# Patient Record
Sex: Male | Born: 1980 | Hispanic: Yes | Marital: Married
Health system: Southern US, Community
[De-identification: ages and names within clinical notes are randomized; demographics above are authoritative.]

---

## 2021-02-11 ENCOUNTER — Emergency Department (HOSPITAL_COMMUNITY): Payer: Self-pay

## 2021-02-11 ENCOUNTER — Emergency Department (HOSPITAL_COMMUNITY)
Admission: EM | Admit: 2021-02-11 | Discharge: 2021-02-11 | Disposition: A | Discharge status: Home / Self Care | Payer: Self-pay | Attending: Emergency Medicine | Admitting: Emergency Medicine

## 2021-02-11 ENCOUNTER — Encounter (HOSPITAL_COMMUNITY): Payer: Self-pay | Admitting: Emergency Medicine

## 2021-02-11 DIAGNOSIS — Z23 Encounter for immunization: Secondary | ICD-10-CM | POA: Insufficient documentation

## 2021-02-11 DIAGNOSIS — T1490XA Injury, unspecified, initial encounter: Secondary | ICD-10-CM

## 2021-02-11 DIAGNOSIS — W19XXXA Unspecified fall, initial encounter: Secondary | ICD-10-CM

## 2021-02-11 DIAGNOSIS — Z20822 Contact with and (suspected) exposure to covid-19: Secondary | ICD-10-CM | POA: Insufficient documentation

## 2021-02-11 DIAGNOSIS — W132XXA Fall from, out of or through roof, initial encounter: Secondary | ICD-10-CM | POA: Insufficient documentation

## 2021-02-11 DIAGNOSIS — S0003XA Contusion of scalp, initial encounter: Secondary | ICD-10-CM | POA: Insufficient documentation

## 2021-02-11 DIAGNOSIS — S20212A Contusion of left front wall of thorax, initial encounter: Secondary | ICD-10-CM | POA: Insufficient documentation

## 2021-02-11 DIAGNOSIS — S301XXA Contusion of abdominal wall, initial encounter: Secondary | ICD-10-CM | POA: Insufficient documentation

## 2021-02-11 DIAGNOSIS — S0990XA Unspecified injury of head, initial encounter: Secondary | ICD-10-CM | POA: Insufficient documentation

## 2021-02-11 LAB — COMPREHENSIVE METABOLIC PANEL
ALT: 23 U/L (ref 0–44)
AST: 23 U/L (ref 15–41)
Albumin: 4.8 g/dL (ref 3.5–5.0)
Alkaline Phosphatase: 83 U/L (ref 38–126)
Anion gap: 14 (ref 5–15)
BUN: 21 mg/dL — ABNORMAL HIGH (ref 6–20)
CO2: 20 mmol/L — ABNORMAL LOW (ref 22–32)
Calcium: 10 mg/dL (ref 8.9–10.3)
Chloride: 105 mmol/L (ref 98–111)
Creatinine, Ser: 1.28 mg/dL — ABNORMAL HIGH (ref 0.61–1.24)
GFR, Estimated: >60 mL/min (ref >60)
Glucose, Bld: 113 mg/dL — ABNORMAL HIGH (ref 70–99)
Potassium: 3.7 mmol/L (ref 3.5–5.1)
Sodium: 139 mmol/L (ref 135–145)
Total Bilirubin: 0.8 mg/dL (ref 0.3–1.2)
Total Protein: 7.9 g/dL (ref 6.5–8.1)

## 2021-02-11 LAB — SAMPLE TO BLOOD BANK

## 2021-02-11 LAB — RESP PANEL BY RT-PCR (FLU A&B, COVID) ARPGX2
Influenza A by PCR: NEGATIVE
Influenza B by PCR: NEGATIVE
SARS Coronavirus 2 by RT PCR: NEGATIVE

## 2021-02-11 LAB — CBC
HCT: 47.2 % (ref 39.0–52.0)
Hemoglobin: 15.8 g/dL (ref 13.0–17.0)
MCH: 27 pg (ref 26.0–34.0)
MCHC: 33.5 g/dL (ref 30.0–36.0)
MCV: 80.7 fL (ref 80.0–100.0)
Platelets: 335 10*3/uL (ref 150–400)
RBC: 5.85 MIL/uL — ABNORMAL HIGH (ref 4.22–5.81)
RDW: 12.1 % (ref 11.5–15.5)
WBC: 11.1 10*3/uL — ABNORMAL HIGH (ref 4.0–10.5)
nRBC: 0 % (ref 0.0–0.2)

## 2021-02-11 LAB — I-STAT CHEM 8, ED
BUN: 34 mg/dL — ABNORMAL HIGH (ref 6–20)
Calcium, Ion: 1.15 mmol/L (ref 1.15–1.40)
Chloride: 105 mmol/L (ref 98–111)
Creatinine, Ser: 1.2 mg/dL (ref 0.61–1.24)
Glucose, Bld: 107 mg/dL — ABNORMAL HIGH (ref 70–99)
HCT: 46 % (ref 39.0–52.0)
Hemoglobin: 15.6 g/dL (ref 13.0–17.0)
Potassium: 5.5 mmol/L — ABNORMAL HIGH (ref 3.5–5.1)
Sodium: 141 mmol/L (ref 135–145)
TCO2: 26 mmol/L (ref 22–32)

## 2021-02-11 LAB — PROTIME-INR
INR: 0.9 (ref 0.8–1.2)
Prothrombin Time: 12.2 seconds (ref 11.4–15.2)

## 2021-02-11 LAB — ETHANOL: Alcohol, Ethyl (B): <10 mg/dL (ref <10)

## 2021-02-11 LAB — LACTIC ACID, PLASMA: Lactic Acid, Venous: 2.8 mmol/L (ref 0.5–1.9)

## 2021-02-11 MED ORDER — MORPHINE SULFATE (PF) 4 MG/ML IV SOLN
4.0000 mg | Freq: Once | INTRAVENOUS | Status: AC
  Administered 2021-02-11: 4 mg via INTRAVENOUS
  Filled 2021-02-11: qty 1

## 2021-02-11 MED ORDER — ONDANSETRON HCL 4 MG/2ML IJ SOLN
4.0000 mg | Freq: Once | INTRAMUSCULAR | Status: AC
  Administered 2021-02-11: 4 mg via INTRAVENOUS
  Filled 2021-02-11: qty 2

## 2021-02-11 MED ORDER — HYDROMORPHONE HCL 1 MG/ML IJ SOLN
1.0000 mg | Freq: Once | INTRAMUSCULAR | Status: AC
  Administered 2021-02-11: 1 mg via INTRAVENOUS

## 2021-02-11 MED ORDER — HYDROMORPHONE HCL 1 MG/ML IJ SOLN
INTRAMUSCULAR | Status: AC
  Filled 2021-02-11: qty 1

## 2021-02-11 MED ORDER — TETANUS-DIPHTH-ACELL PERTUSSIS 5-2.5-18.5 LF-MCG/0.5 IM SUSY
0.5000 mL | PREFILLED_SYRINGE | Freq: Once | INTRAMUSCULAR | Status: AC
  Administered 2021-02-11: 0.5 mL via INTRAMUSCULAR

## 2021-02-11 MED ORDER — IOHEXOL 300 MG/ML  SOLN
100.0000 mL | Freq: Once | INTRAMUSCULAR | Status: AC | PRN
  Administered 2021-02-11: 100 mL via INTRAVENOUS

## 2021-02-11 MED ORDER — HYDROCODONE-ACETAMINOPHEN 5-325 MG PO TABS
1.0000 | ORAL_TABLET | Freq: Four times a day (QID) | ORAL | 0 refills | Status: AC | PRN
Start: 2021-02-11 — End: ?

## 2021-02-11 MED ORDER — LACTATED RINGERS IV BOLUS
1000.0000 mL | Freq: Once | INTRAVENOUS | Status: AC
  Administered 2021-02-11: 1000 mL via INTRAVENOUS

## 2021-02-11 NOTE — ED Provider Notes (Signed)
[facility] EMERGENCY DEPARTMENT Provider Note   CSN: [id number] Arrival date & time: 02/11/21  1543     History Chief Complaint  Patient presents with  . Fall    Eric Fuller is a 40 y.o. male here with trauma.  Patient fell off a 10 foot roof and landed on his left side and his head.  Patient is brought in by his friend.  He is unable to answer any questions.  He has no known medical problems.  Patient was noted to have bruising on the left side of his chest.  The history is provided by the patient and a friend.       History reviewed. No pertinent past medical history.  There are no problems to display for this patient.   History reviewed. No pertinent surgical history.     No family history on file.     Home Medications Prior to Admission medications   Not on File    Allergies    Patient has no known allergies.  Review of Systems   Review of Systems  Unable to perform ROS: Acuity of condition  Musculoskeletal:       L chest wall pain   All other systems reviewed and are negative.   Physical Exam Updated Vital Signs BP 114/77   Pulse 81   Temp (!) 97.3 F (36.3 C) (Oral)   Resp (!) 26   Ht 5\' 6"  (1.676 m)   Wt 70.3 kg   SpO2 100%   BMI 25.02 kg/m   Physical Exam Vitals and nursing note reviewed.  Constitutional:      Comments: Uncomfortable grimacing in pain but unable to answer any questions  HENT:     Head:     Comments: No obvious scalp hematoma    Mouth/Throat:     Mouth: Mucous membranes are dry.  Eyes:     Extraocular Movements: Extraocular movements intact.     Pupils: Pupils are equal, round, and reactive to light.  Neck:     Comments: C-collar in place Cardiovascular:     Rate and Rhythm: Normal rate and regular rhythm.     Pulses: Normal pulses.     Heart sounds: Normal heart sounds.  Pulmonary:     Comments: Tachypneic, bruising on the left chest wall.  No obvious crepitus. Abdominal:     Comments:  Bruising on the left upper abdomen  Musculoskeletal:     Comments: There is some tenderness along the left scapula and some bruising in the left scapular and left lower rib area.  No obvious step-off or midline deformity  Skin:    General: Skin is warm.     Capillary Refill: Capillary refill takes less than 2 seconds.  Neurological:     Comments: Unable to answer questions per patient is moving all extremities  Psychiatric:     Comments: Unable      ED Results / Procedures / Treatments   Labs (all labs ordered are listed, but only abnormal results are displayed) Labs Reviewed  COMPREHENSIVE METABOLIC PANEL - Abnormal; Notable for the following components:      Result Value   CO2 20 (*)    Glucose, Bld 113 (*)    BUN 21 (*)    Creatinine, Ser 1.28 (*)    All other components within normal limits  CBC - Abnormal; Notable for the following components:   WBC 11.1 (*)    RBC 5.85 (*)    All other components  within normal limits  LACTIC ACID, PLASMA - Abnormal; Notable for the following components:   Lactic Acid, Venous 2.8 (*)    All other components within normal limits  I-STAT CHEM 8, ED - Abnormal; Notable for the following components:   Potassium 5.5 (*)    BUN 34 (*)    Glucose, Bld 107 (*)    All other components within normal limits  RESP PANEL BY RT-PCR (FLU A&B, COVID) ARPGX2  ETHANOL  PROTIME-INR  URINALYSIS, ROUTINE W REFLEX MICROSCOPIC  SAMPLE TO BLOOD BANK    EKG None  Radiology CT HEAD WO CONTRAST  Result Date: 02/11/2021 CLINICAL DATA:  Status post fall. EXAM: CT HEAD WITHOUT CONTRAST CT CERVICAL SPINE WITHOUT CONTRAST TECHNIQUE: Multidetector CT imaging of the head and cervical spine was performed following the standard protocol without intravenous contrast. Multiplanar CT image reconstructions of the cervical spine were also generated. COMPARISON:  None. FINDINGS: CT HEAD FINDINGS Brain: No evidence of acute infarction, hemorrhage, hydrocephalus,  extra-axial collection or mass lesion/mass effect. Vascular: No hyperdense vessel or unexpected calcification. Skull: Normal. Negative for fracture or focal lesion. Sinuses/Orbits: No acute finding. Other: None CT CERVICAL SPINE FINDINGS Alignment: Normal. Skull base and vertebrae: No acute fracture. No primary bone lesion or focal pathologic process. Soft tissues and spinal canal: No prevertebral fluid or swelling. No visible canal hematoma. Disc levels:  Normal Upper chest: Negative. Other: None IMPRESSION: 1. No acute intracranial abnormalities. 2. No evidence for cervical spine fracture. Electronically Signed   By: Signa Kell M.D.   On: 02/11/2021 17:04   CT CHEST W CONTRAST  Result Date: 02/11/2021 CLINICAL DATA:  Larey Seat, back pain EXAM: CT CHEST, ABDOMEN, AND PELVIS WITH CONTRAST TECHNIQUE: Multidetector CT imaging of the chest, abdomen and pelvis was performed following the standard protocol during bolus administration of intravenous contrast. CONTRAST:  OMNIPAQUE IOHEXOL 300 MG/ML  SOLN COMPARISON:  02/11/2021 FINDINGS: CT CHEST FINDINGS Cardiovascular: The heart and great vessels are unremarkable without pericardial effusion. No evidence of vascular injury. Mediastinum/Nodes: No enlarged mediastinal, hilar, or axillary lymph nodes. Thyroid gland, trachea, and esophagus demonstrate no significant findings. Lungs/Pleura: No acute airspace disease, effusion, or pneumothorax. Central airways are patent. Musculoskeletal: No acute or destructive bony lesions. Reconstructed images demonstrate no additional findings. CT ABDOMEN PELVIS FINDINGS Hepatobiliary: No hepatic injury or perihepatic hematoma. Gallbladder is unremarkable Pancreas: Unremarkable. No pancreatic ductal dilatation or surrounding inflammatory changes. Spleen: No splenic injury or perisplenic hematoma. Adrenals/Urinary Tract: No adrenal hemorrhage or renal injury identified. Bladder is unremarkable. Incidental punctate less than 2 mm  nonobstructing right renal calculus. Stomach/Bowel: No bowel obstruction or ileus. No bowel wall thickening or inflammatory change. Vascular/Lymphatic: No evidence of vascular injury. No significant atherosclerosis. Anatomic variant with separate origins of the common hepatic artery and celiac axis from the aorta. No pathologic adenopathy. Reproductive: Prostate is unremarkable. Other: No free fluid or free gas.  No abdominal wall hernia. Musculoskeletal: No acute or destructive bony lesions. Reconstructed images demonstrate no additional findings. IMPRESSION: 1. No acute intrathoracic, intra-abdominal, or intrapelvic trauma. 2. Punctate less than 2 mm nonobstructing right renal calculus. Electronically Signed   By: Sharlet Salina M.D.   On: 02/11/2021 17:07   CT CERVICAL SPINE WO CONTRAST  Result Date: 02/11/2021 CLINICAL DATA:  Status post fall. EXAM: CT HEAD WITHOUT CONTRAST CT CERVICAL SPINE WITHOUT CONTRAST TECHNIQUE: Multidetector CT imaging of the head and cervical spine was performed following the standard protocol without intravenous contrast. Multiplanar CT image reconstructions of the cervical spine  were also generated. COMPARISON:  None. FINDINGS: CT HEAD FINDINGS Brain: No evidence of acute infarction, hemorrhage, hydrocephalus, extra-axial collection or mass lesion/mass effect. Vascular: No hyperdense vessel or unexpected calcification. Skull: Normal. Negative for fracture or focal lesion. Sinuses/Orbits: No acute finding. Other: None CT CERVICAL SPINE FINDINGS Alignment: Normal. Skull base and vertebrae: No acute fracture. No primary bone lesion or focal pathologic process. Soft tissues and spinal canal: No prevertebral fluid or swelling. No visible canal hematoma. Disc levels:  Normal Upper chest: Negative. Other: None IMPRESSION: 1. No acute intracranial abnormalities. 2. No evidence for cervical spine fracture. Electronically Signed   By: Signa Kell M.D.   On: 02/11/2021 17:04   CT  ABDOMEN PELVIS W CONTRAST  Result Date: 02/11/2021 CLINICAL DATA:  Larey Seat, back pain EXAM: CT CHEST, ABDOMEN, AND PELVIS WITH CONTRAST TECHNIQUE: Multidetector CT imaging of the chest, abdomen and pelvis was performed following the standard protocol during bolus administration of intravenous contrast. CONTRAST:  OMNIPAQUE IOHEXOL 300 MG/ML  SOLN COMPARISON:  02/11/2021 FINDINGS: CT CHEST FINDINGS Cardiovascular: The heart and great vessels are unremarkable without pericardial effusion. No evidence of vascular injury. Mediastinum/Nodes: No enlarged mediastinal, hilar, or axillary lymph nodes. Thyroid gland, trachea, and esophagus demonstrate no significant findings. Lungs/Pleura: No acute airspace disease, effusion, or pneumothorax. Central airways are patent. Musculoskeletal: No acute or destructive bony lesions. Reconstructed images demonstrate no additional findings. CT ABDOMEN PELVIS FINDINGS Hepatobiliary: No hepatic injury or perihepatic hematoma. Gallbladder is unremarkable Pancreas: Unremarkable. No pancreatic ductal dilatation or surrounding inflammatory changes. Spleen: No splenic injury or perisplenic hematoma. Adrenals/Urinary Tract: No adrenal hemorrhage or renal injury identified. Bladder is unremarkable. Incidental punctate less than 2 mm nonobstructing right renal calculus. Stomach/Bowel: No bowel obstruction or ileus. No bowel wall thickening or inflammatory change. Vascular/Lymphatic: No evidence of vascular injury. No significant atherosclerosis. Anatomic variant with separate origins of the common hepatic artery and celiac axis from the aorta. No pathologic adenopathy. Reproductive: Prostate is unremarkable. Other: No free fluid or free gas.  No abdominal wall hernia. Musculoskeletal: No acute or destructive bony lesions. Reconstructed images demonstrate no additional findings. IMPRESSION: 1. No acute intrathoracic, intra-abdominal, or intrapelvic trauma. 2. Punctate less than 2 mm  nonobstructing right renal calculus. Electronically Signed   By: Sharlet Salina M.D.   On: 02/11/2021 17:07   DG Pelvis Portable  Result Date: 02/11/2021 CLINICAL DATA:  Larey Seat 10 feet from roof EXAM: PORTABLE PELVIS 1-2 VIEWS COMPARISON:  None. FINDINGS: Single frontal view of the pelvis demonstrates no acute displaced fractures. Joint spaces are well preserved. Sacroiliac joints are normal. The soft tissues are unremarkable. IMPRESSION: 1. No acute displaced fracture. Electronically Signed   By: Sharlet Salina M.D.   On: 02/11/2021 16:17   CT T-SPINE NO CHARGE  Result Date: 02/11/2021 CLINICAL DATA:  Level 2 fall.  Back pain EXAM: CT THORACIC SPINE WITHOUT CONTRAST TECHNIQUE: Multidetector CT images of the thoracic were obtained using the standard protocol without intravenous contrast. Imaging data reconstructed from CT chest. COMPARISON:  None. FINDINGS: Alignment: Normal Vertebrae: Negative for fracture.  No focal bone lesion. Paraspinal and other soft tissues: Negative Disc levels: Normal disc spaces. No significant degenerative change. No spinal stenosis. IMPRESSION: Negative Electronically Signed   By: Marlan Palau M.D.   On: 02/11/2021 16:58   CT L-SPINE NO CHARGE  Result Date: 02/11/2021 CLINICAL DATA:  Fall.  Back pain. EXAM: CT LUMBAR SPINE WITHOUT CONTRAST TECHNIQUE: Multidetector CT imaging of the lumbar spine was performed without intravenous contrast administration.  Multiplanar CT image reconstructions were also generated. Imaging dated reconstructed from CT abdomen pelvis. COMPARISON:  None. FINDINGS: Segmentation: Normal Alignment: Normal Vertebrae: Negative for fracture or bone lesion. Paraspinal and other soft tissues: Negative Disc levels: Disc spaces maintained. No spinal stenosis or neural impingement. IMPRESSION: Negative Electronically Signed   By: Marlan Palauharles  Clark M.D.   On: 02/11/2021 16:56   DG Chest Port 1 View  Result Date: 02/11/2021 CLINICAL DATA:  Larey SeatFell 10 feet from roof  EXAM: PORTABLE CHEST 1 VIEW COMPARISON:  None. FINDINGS: Single frontal view of the chest demonstrates an unremarkable cardiac silhouette. No airspace disease, effusion, or pneumothorax. No acute displaced fracture. IMPRESSION: 1. No acute intrathoracic process. Electronically Signed   By: Sharlet SalinaMichael  Brown M.D.   On: 02/11/2021 16:16    Procedures Procedures   Medications Ordered in ED Medications  HYDROmorphone (DILAUDID) 1 MG/ML injection (has no administration in time range)  lactated ringers bolus 1,000 mL (has no administration in time range)  HYDROmorphone (DILAUDID) injection 1 mg (1 mg Intravenous Given 02/11/21 1600)  Tdap (BOOSTRIX) injection 0.5 mL (0.5 mLs Intramuscular Given 02/11/21 1610)  iohexol (OMNIPAQUE) 300 MG/ML solution 100 mL (100 mLs Intravenous Contrast Given 02/11/21 1627)  morphine 4 MG/ML injection 4 mg (4 mg Intravenous Given 02/11/21 1732)  ondansetron (ZOFRAN) injection 4 mg (4 mg Intravenous Given 02/11/21 1732)    ED Course  I have reviewed the triage vital signs and the nursing notes.  Pertinent labs & imaging results that were available during my care of the patient were reviewed by me and considered in my medical decision making (see chart for details).    MDM Rules/Calculators/A&P                         Diona BrownerHector Hinchcliff is a 40 y.o. male here presenting after he fell off a 10 foot roof.  Patient has bruising of the left chest and left scapula.  Patient is also unable to answer any questions and he is clearly in pain. Will get trauma scans and trauma x-rays   6:17 PM Labs showed lactate 2.8. CT showed no fractures or pneumothorax or hemothorax. Pain controlled. Will dc home leg pain medicine.  Likely muscle strain  Final Clinical Impression(s) / ED Diagnoses Final diagnoses:  Fall    Rx / DC Orders ED Discharge Orders    None       Charlynne PanderYao, Kesia Dalto Hsienta, MD 02/11/21 1820

## 2021-02-11 NOTE — ED Notes (Signed)
To CT

## 2021-02-11 NOTE — ED Triage Notes (Signed)
Patient brought in by friend after falling 10 feet from a roof and landing on his left side and head. Patient tachypneic, appears alert but is not answering questions. Friend states the patient does not have any known medical problems or allergies.

## 2021-02-11 NOTE — Discharge Instructions (Addendum)
Take motrin for pain   Take vicodin for severe pain   Use incentive spirometer every 3-4 hours during the day to help expand your lungs  See your doctor   Return to ER if you have worse chest pain, trouble breathing, fever, cough

## 2021-02-11 NOTE — Progress Notes (Signed)
Orthopedic Tech Progress Note Patient Details:  Eric Fuller 12/20/1873 [id number] LEVEL 2 TRAUMA Patient ID: Eric Fuller, male   DOB: 12/20/1873, 39 y.o.   MRN: [id number]   [name] 02/11/2021, 4:07 PM

## 2021-09-09 IMAGING — CT CT T SPINE W/O CM
3 of 4 series · 11 of 33 positions shown, 13 images · non-contrast
Comparison: None.

CLINICAL DATA: Level 2 fall.  Back pain

EXAM:
CT THORACIC SPINE WITHOUT CONTRAST
TECHNIQUE: Multidetector CT images of the thoracic were obtained using the
standard protocol without intravenous contrast. Imaging data
reconstructed from CT chest.

[Series 3: tspine st · axial · 0.29mm/px · z∈[+695,+917]mm · 3 of 167 slices shown, 4 images]
[im 28/167  soft-tissue]
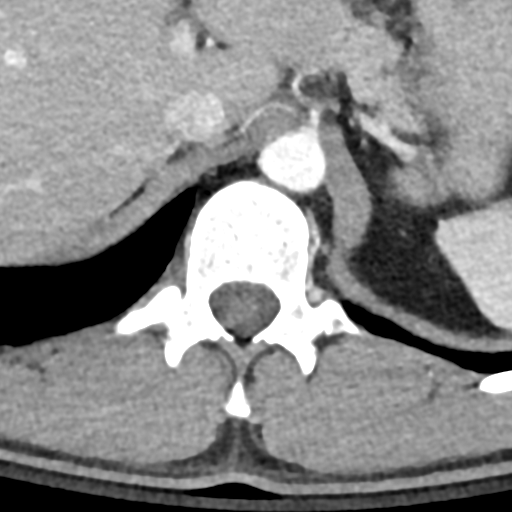
[im 28/167  bone]
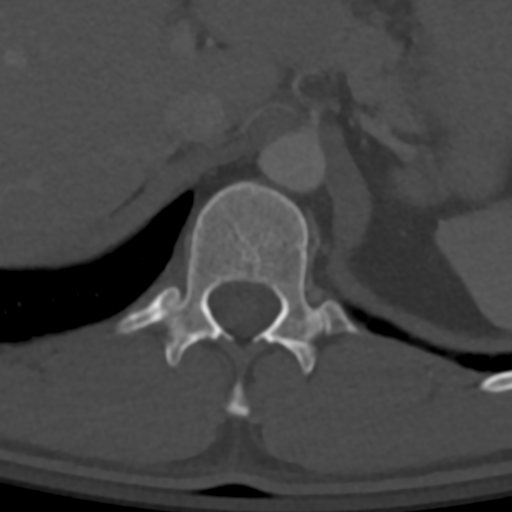
[im 84/167  bone]
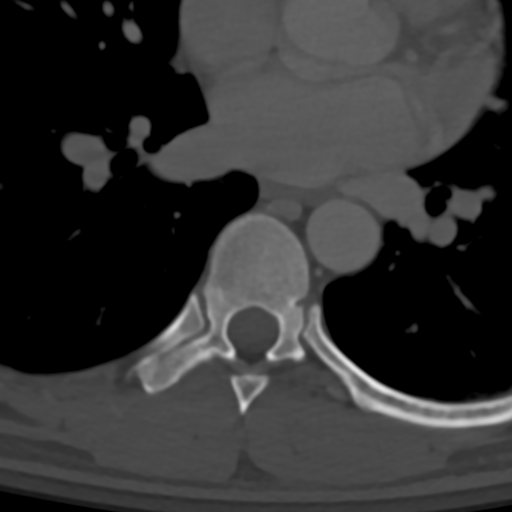
[im 139/167  bone]
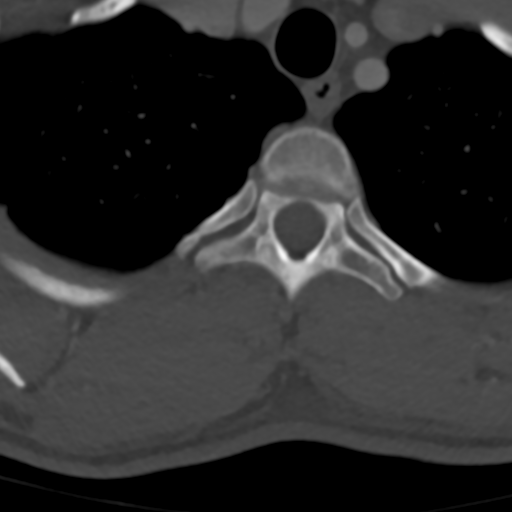

[Series 5: tspine bone cor · coronal · 0.37mm/px · 3 of 77 slices shown]
[im 16/77  bone]
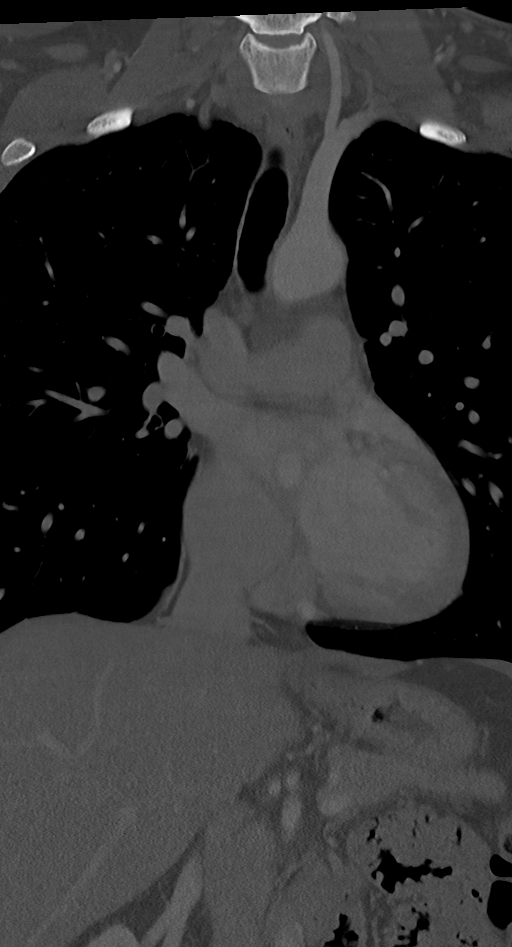
[im 31/77  bone]
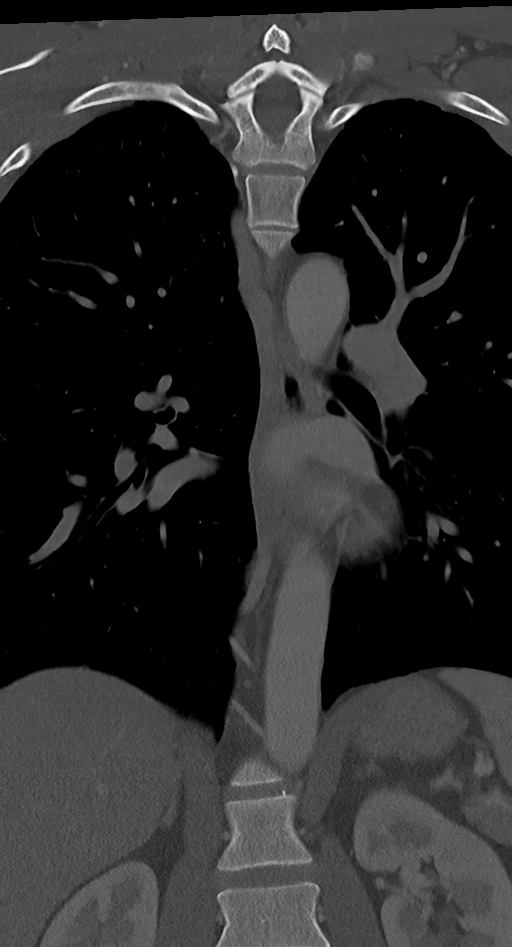
[im 46/77  bone]
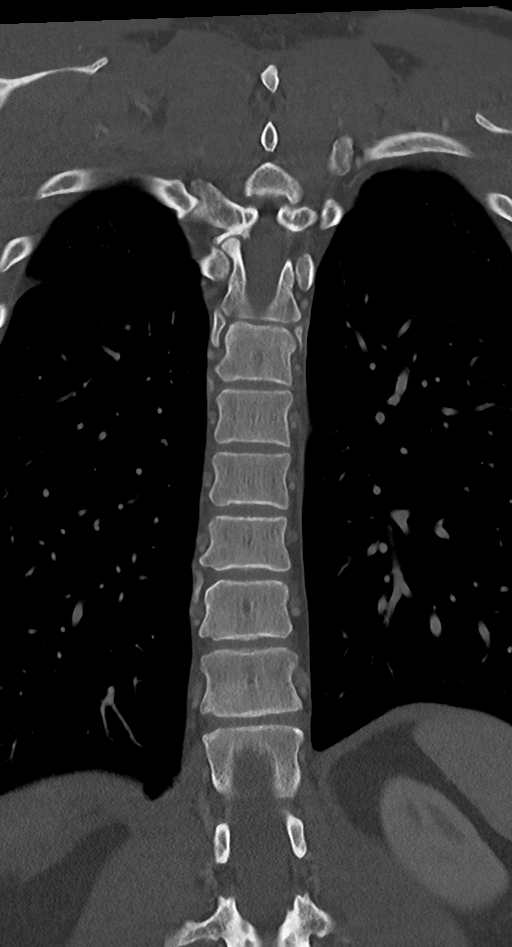

[Series 6: tspine bone sag · sagittal · 0.34mm/px · 5 of 58 slices shown, 6 images]
[im 20/58  bone]
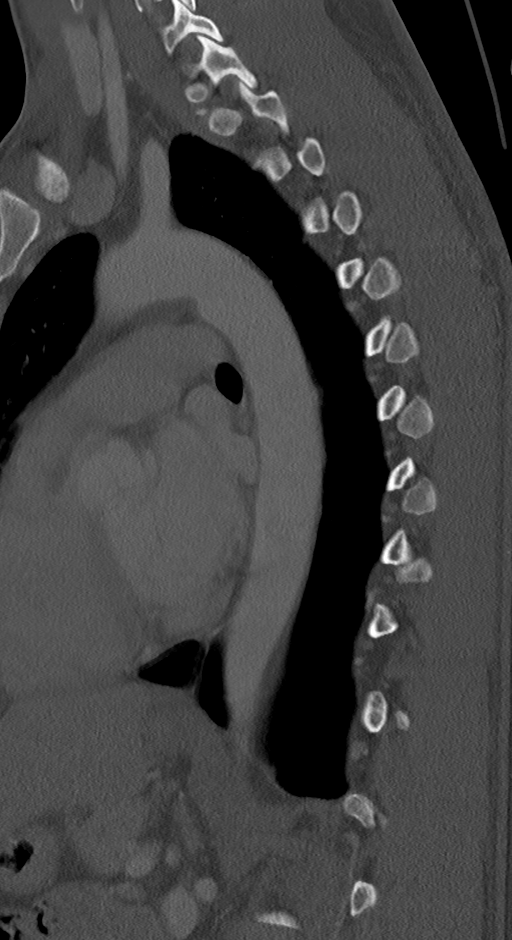
[im 24/58  bone]
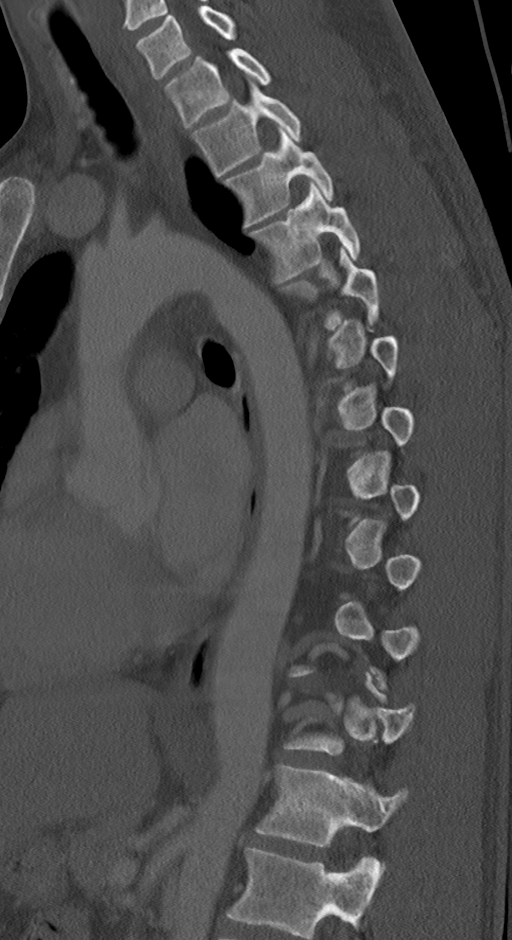
[im 29/58  soft-tissue]
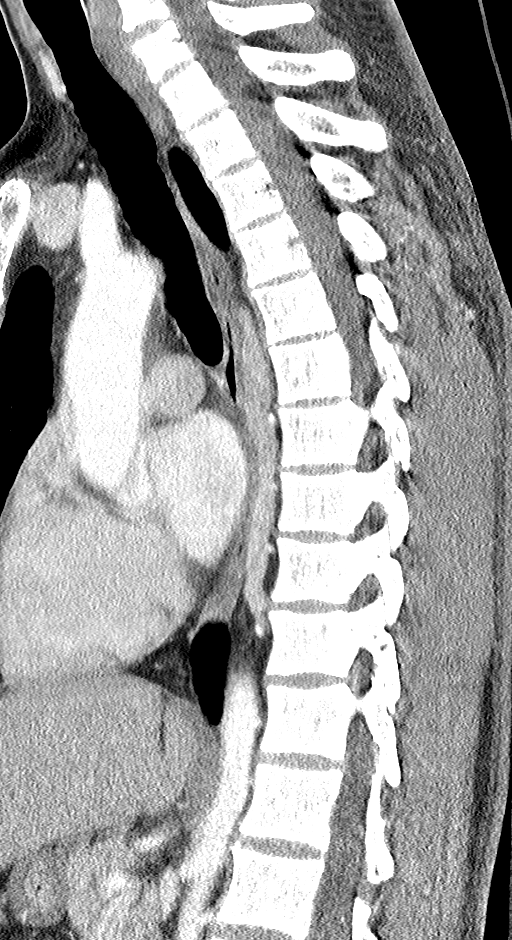
[im 29/58  bone]
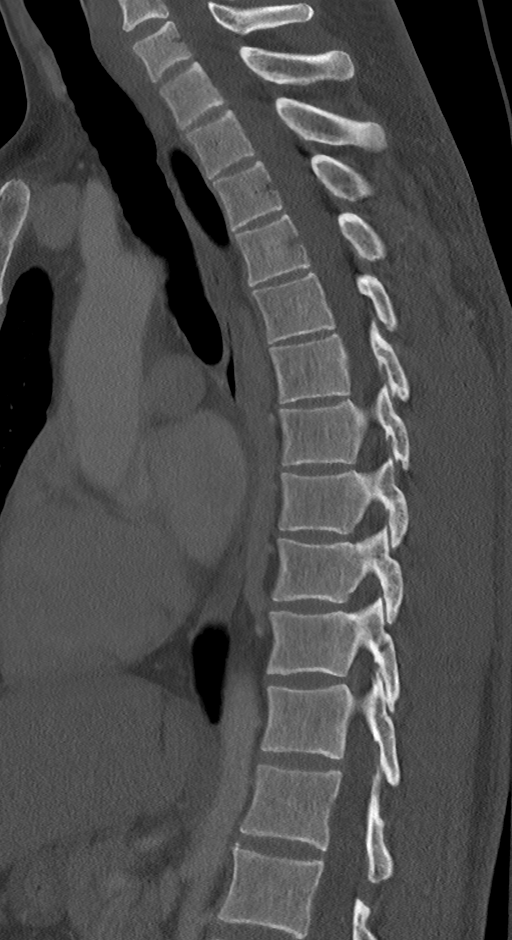
[im 34/58  bone]
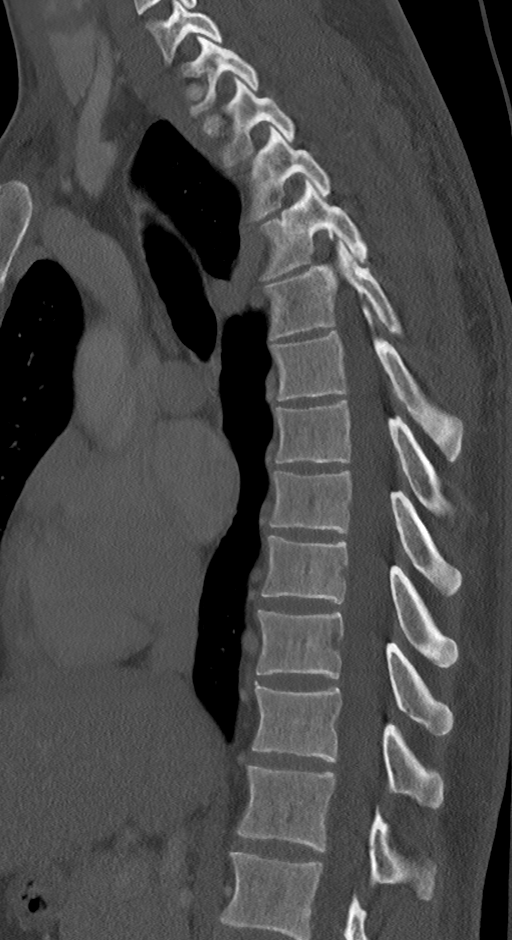
[im 39/58  bone]
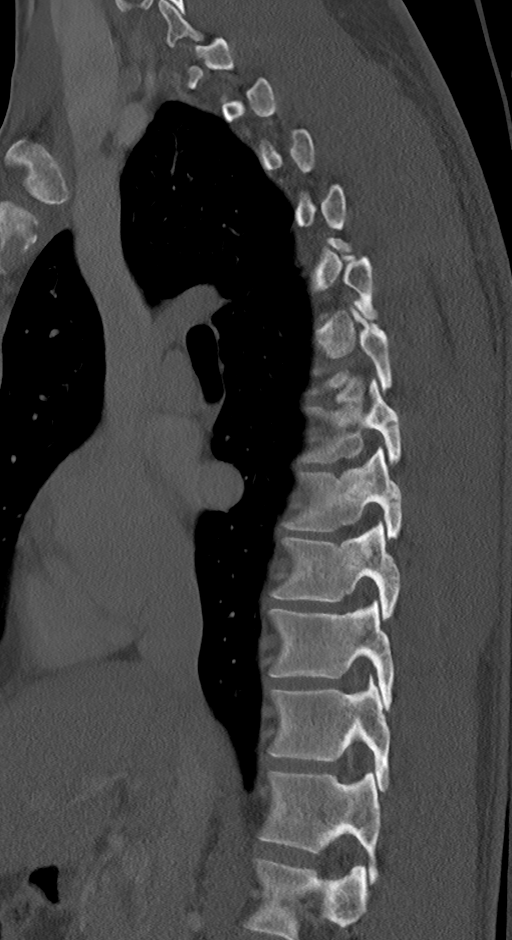

[11 of 33 positions shown; findings below may reference images not displayed]

FINDINGS: Alignment: Normal

Vertebrae: Negative for fracture.  No focal bone lesion.

Paraspinal and other soft tissues: Negative

Disc levels: Normal disc spaces. No significant degenerative change.
No spinal stenosis.
IMPRESSION: Negative

## 2021-09-09 IMAGING — CT CT HEAD W/O CM
4 series · 16 of 47 positions shown, 18 images · non-contrast
Comparison: None.

CLINICAL DATA: Status post fall.

EXAM:
CT HEAD WITHOUT CONTRAST
CT CERVICAL SPINE WITHOUT CONTRAST
TECHNIQUE: Multidetector CT imaging of the head and cervical spine was
performed following the standard protocol without intravenous
contrast. Multiplanar CT image reconstructions of the cervical spine
were also generated.

[Series 3: head bone · axial · 0.42mm/px · z∈[+1063,+1097]mm · 3 of 85 slices shown]
[im 9/85  bone]
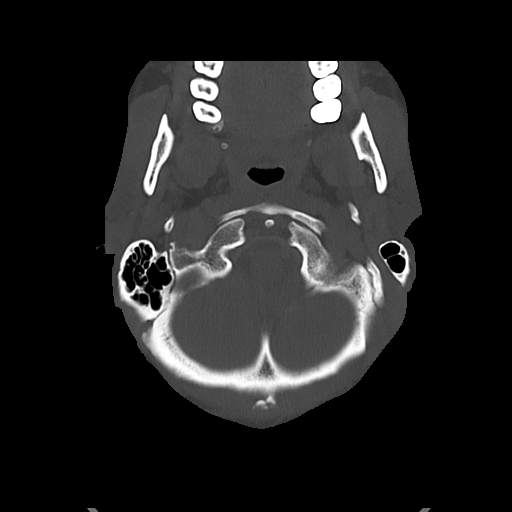
[im 17/85  bone]
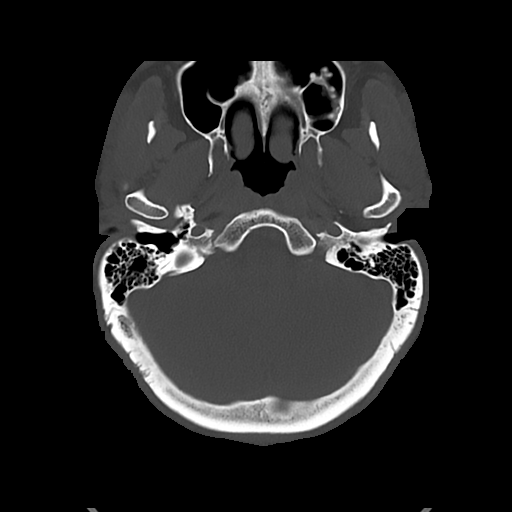
[im 26/85  bone]
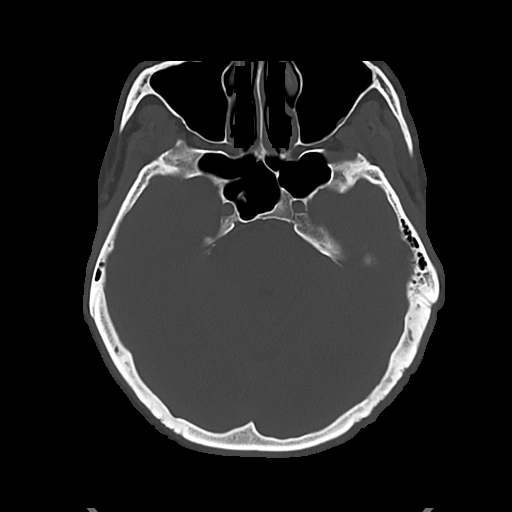

[Series 4: head wo · axial · 0.42mm/px · z∈[+1067,+1187]mm · 7 of 34 slices shown, 9 images]
[im 5/34  brain]
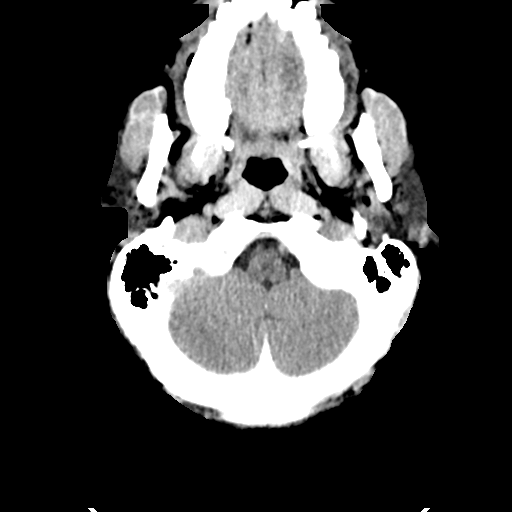
[im 5/34  bone]
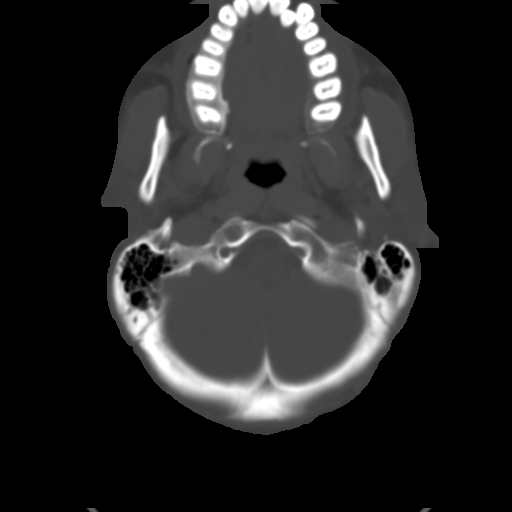
[im 9/34  brain]
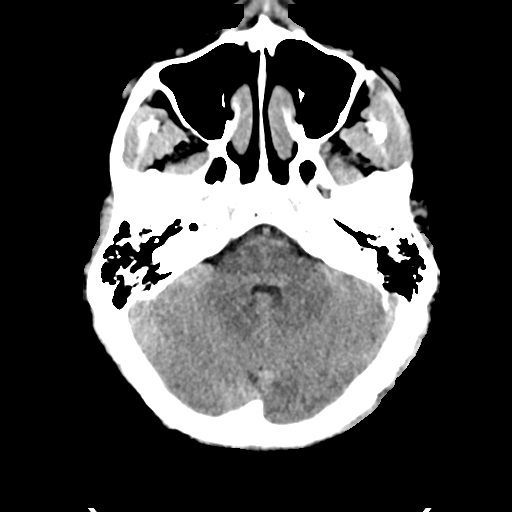
[im 13/34  brain]
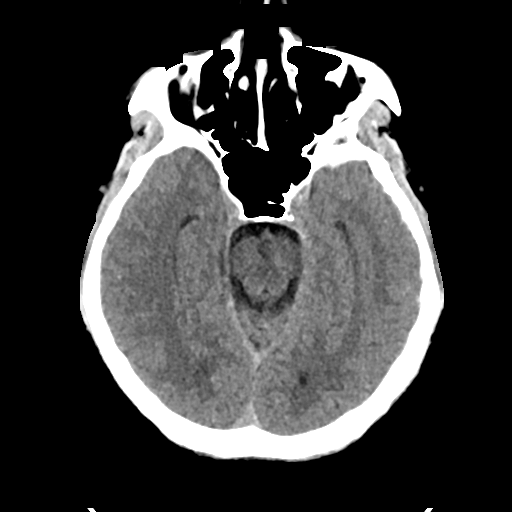
[im 17/34  brain]
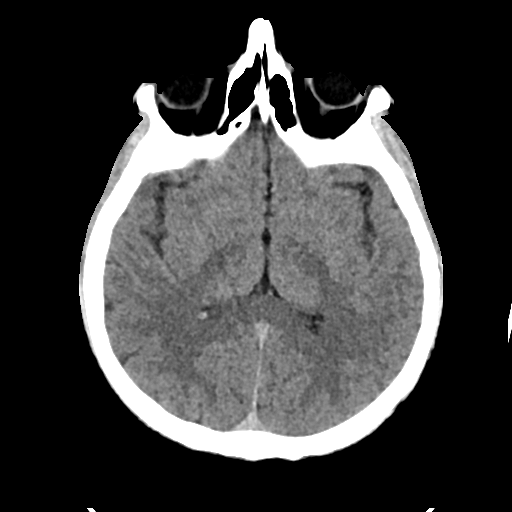
[im 21/34  brain]
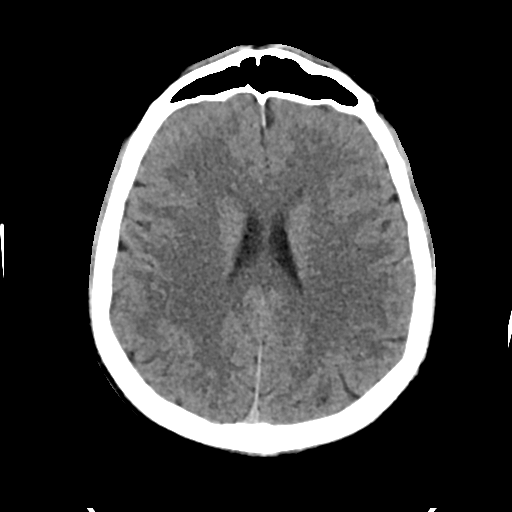
[im 21/34  bone]
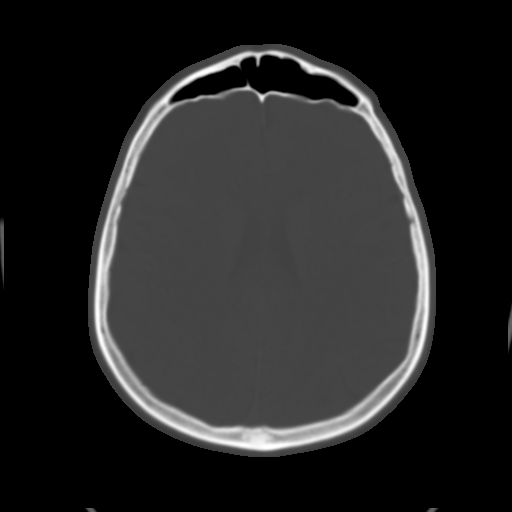
[im 25/34  brain]
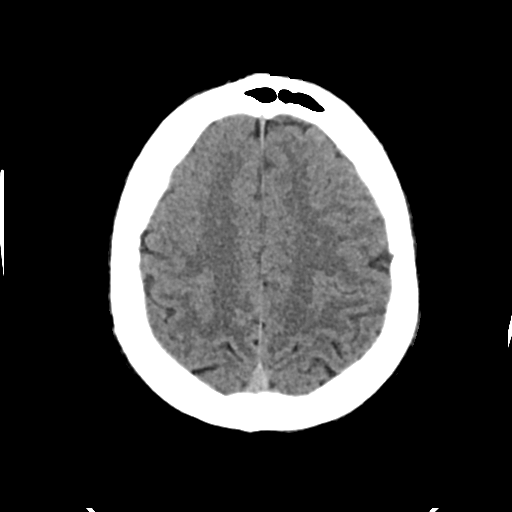
[im 29/34  brain]
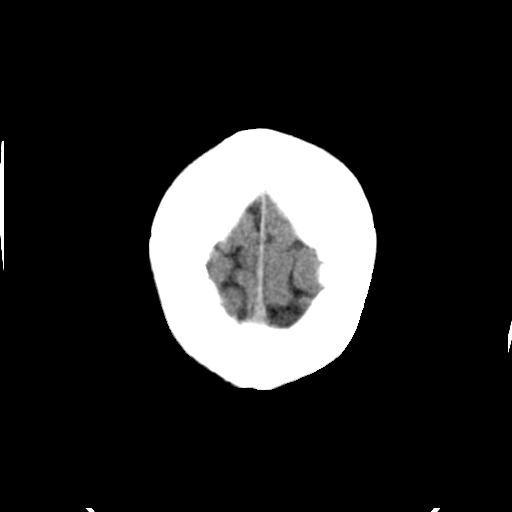

[Series 5: cor soft · coronal · 0.33mm/px · 3 of 67 slices shown]
[im 23/67  brain]
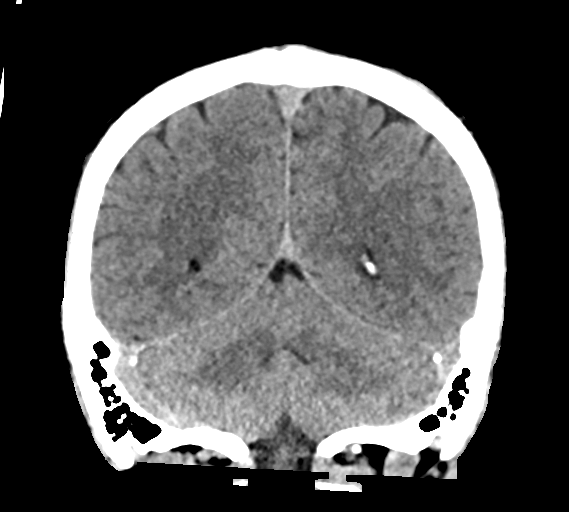
[im 30/67  brain]
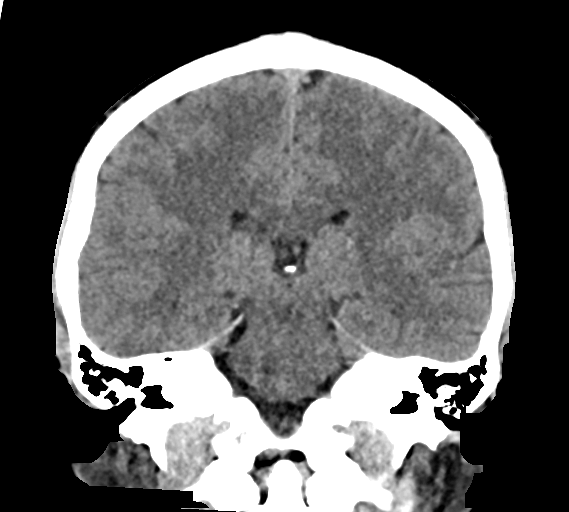
[im 37/67  brain]
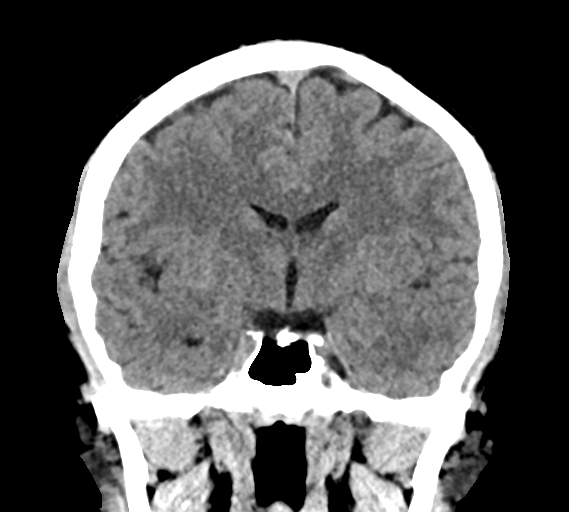

[Series 6: sag soft · sagittal · 0.33mm/px · 3 of 64 slices shown]
[im 22/64  brain]
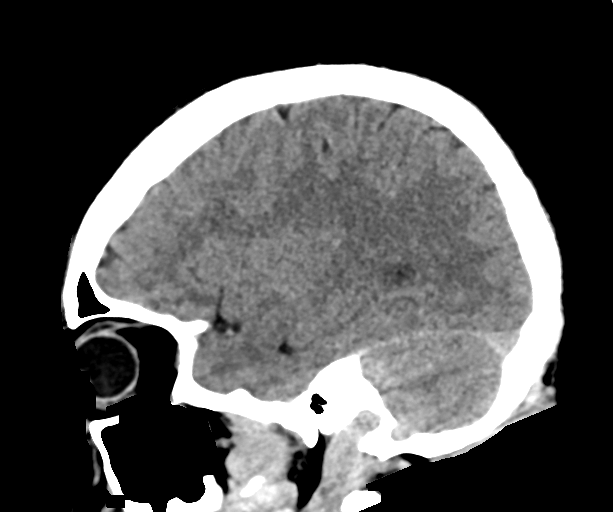
[im 32/64  brain]
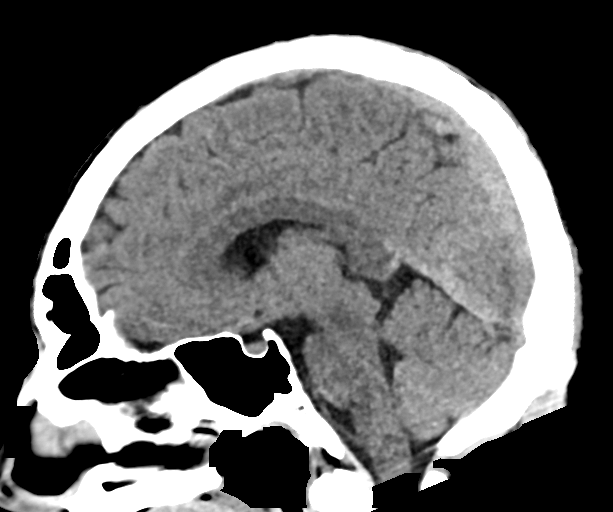
[im 43/64  brain]
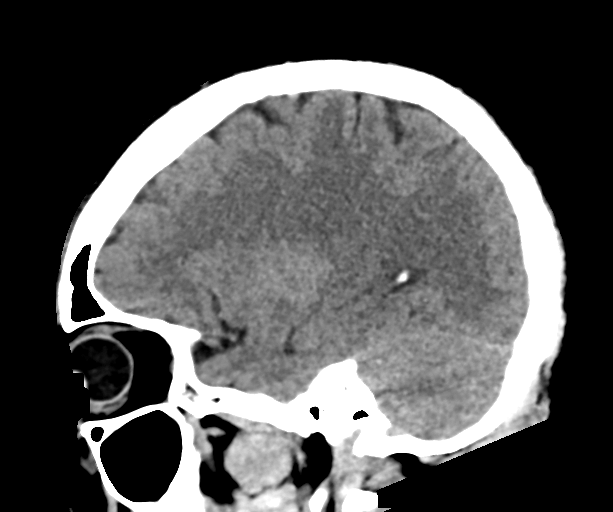

[16 of 47 positions shown; findings below may reference images not displayed]

FINDINGS: CT HEAD FINDINGS

Brain: No evidence of acute infarction, hemorrhage, hydrocephalus,
extra-axial collection or mass lesion/mass effect.

Vascular: No hyperdense vessel or unexpected calcification.

Skull: Normal. Negative for fracture or focal lesion.

Sinuses/Orbits: No acute finding.

Other: None

CT CERVICAL SPINE FINDINGS

Alignment: Normal.

Skull base and vertebrae: No acute fracture. No primary bone lesion
or focal pathologic process.

Soft tissues and spinal canal: No prevertebral fluid or swelling. No
visible canal hematoma.

Disc levels:  Normal

Upper chest: Negative.

Other: None
IMPRESSION: 1. No acute intracranial abnormalities.
2. No evidence for cervical spine fracture.
